# Patient Record
Sex: Female | Born: 1965 | Hispanic: No | Marital: Married
Health system: Southern US, Community
[De-identification: ages and names within clinical notes are randomized; demographics above are authoritative.]

## PROBLEM LIST (undated history)

## (undated) HISTORY — PX: TOTAL THYROIDECTOMY: SHX2547

---

## 2008-09-21 ENCOUNTER — Emergency Department: Payer: Self-pay | Admitting: Emergency Medicine

## 2008-09-28 ENCOUNTER — Ambulatory Visit: Payer: Self-pay | Admitting: Family Medicine

## 2008-10-18 ENCOUNTER — Encounter: Payer: Self-pay | Admitting: Family Medicine

## 2008-10-21 ENCOUNTER — Encounter: Payer: Self-pay | Admitting: Family Medicine

## 2009-08-01 ENCOUNTER — Ambulatory Visit: Payer: Self-pay | Admitting: Family Medicine

## 2009-08-09 ENCOUNTER — Ambulatory Visit: Payer: Self-pay | Admitting: Family Medicine

## 2016-09-18 ENCOUNTER — Other Ambulatory Visit: Payer: Self-pay | Admitting: Internal Medicine

## 2016-09-18 ENCOUNTER — Ambulatory Visit
Admission: RE | Admit: 2016-09-18 | Discharge: 2016-09-18 | Disposition: A | Discharge status: Home / Self Care | Payer: BLUE CROSS/BLUE SHIELD | Source: Ambulatory Visit | Attending: Internal Medicine | Admitting: Internal Medicine

## 2016-09-18 DIAGNOSIS — R1031 Right lower quadrant pain: Secondary | ICD-10-CM | POA: Insufficient documentation

## 2016-09-18 DIAGNOSIS — R1032 Left lower quadrant pain: Secondary | ICD-10-CM

## 2016-09-18 MED ORDER — IOPAMIDOL (ISOVUE-300) INJECTION 61%
100.0000 mL | Freq: Once | INTRAVENOUS | Status: AC | PRN
  Administered 2016-09-18: 100 mL via INTRAVENOUS

## 2017-11-12 ENCOUNTER — Other Ambulatory Visit: Payer: Self-pay | Admitting: Internal Medicine

## 2017-11-12 DIAGNOSIS — Z1231 Encounter for screening mammogram for malignant neoplasm of breast: Secondary | ICD-10-CM

## 2017-11-24 ENCOUNTER — Ambulatory Visit
Admission: RE | Admit: 2017-11-24 | Discharge: 2017-11-24 | Disposition: A | Discharge status: Home / Self Care | Payer: BLUE CROSS/BLUE SHIELD | Source: Ambulatory Visit | Attending: Internal Medicine | Admitting: Internal Medicine

## 2017-11-24 DIAGNOSIS — Z1231 Encounter for screening mammogram for malignant neoplasm of breast: Secondary | ICD-10-CM | POA: Insufficient documentation

## 2017-12-31 ENCOUNTER — Ambulatory Visit
Admission: RE | Admit: 2017-12-31 | Discharge: 2017-12-31 | Disposition: A | Discharge status: Home / Self Care | Payer: BLUE CROSS/BLUE SHIELD | Source: Ambulatory Visit | Attending: Internal Medicine | Admitting: Internal Medicine

## 2017-12-31 ENCOUNTER — Ambulatory Visit: Payer: BLUE CROSS/BLUE SHIELD | Admitting: Certified Registered"

## 2017-12-31 ENCOUNTER — Encounter: Payer: Self-pay | Admitting: *Deleted

## 2017-12-31 ENCOUNTER — Encounter
Admission: RE | Disposition: A | Discharge status: Home / Self Care | Payer: Self-pay | Source: Ambulatory Visit | Attending: Internal Medicine

## 2017-12-31 DIAGNOSIS — Z79899 Other long term (current) drug therapy: Secondary | ICD-10-CM | POA: Insufficient documentation

## 2017-12-31 DIAGNOSIS — Z79890 Hormone replacement therapy (postmenopausal): Secondary | ICD-10-CM | POA: Insufficient documentation

## 2017-12-31 DIAGNOSIS — Z1211 Encounter for screening for malignant neoplasm of colon: Secondary | ICD-10-CM | POA: Diagnosis present

## 2017-12-31 DIAGNOSIS — R1314 Dysphagia, pharyngoesophageal phase: Secondary | ICD-10-CM | POA: Insufficient documentation

## 2017-12-31 DIAGNOSIS — K640 First degree hemorrhoids: Secondary | ICD-10-CM | POA: Insufficient documentation

## 2017-12-31 HISTORY — PX: ESOPHAGOGASTRODUODENOSCOPY: SHX5428

## 2017-12-31 HISTORY — PX: COLONOSCOPY WITH PROPOFOL: SHX5780

## 2017-12-31 SURGERY — EGD (ESOPHAGOGASTRODUODENOSCOPY)
Anesthesia: General

## 2017-12-31 MED ORDER — PROPOFOL 500 MG/50ML IV EMUL
INTRAVENOUS | Status: AC
  Filled 2017-12-31: qty 50

## 2017-12-31 MED ORDER — PROPOFOL 10 MG/ML IV BOLUS
INTRAVENOUS | Status: DC | PRN
  Administered 2017-12-31 (×3): 30 mg via INTRAVENOUS

## 2017-12-31 MED ORDER — SODIUM CHLORIDE 0.9 % IV SOLN
INTRAVENOUS | Status: DC
  Administered 2017-12-31: 1000 mL via INTRAVENOUS

## 2017-12-31 MED ORDER — LIDOCAINE HCL (PF) 2 % IJ SOLN
INTRAMUSCULAR | Status: AC
  Filled 2017-12-31: qty 10

## 2017-12-31 MED ORDER — PHENYLEPHRINE HCL 10 MG/ML IJ SOLN
INTRAMUSCULAR | Status: DC | PRN
  Administered 2017-12-31 (×2): 100 ug via INTRAVENOUS
  Administered 2017-12-31 (×2): 50 ug via INTRAVENOUS

## 2017-12-31 MED ORDER — GLYCOPYRROLATE 0.2 MG/ML IJ SOLN
INTRAMUSCULAR | Status: AC
  Filled 2017-12-31: qty 1

## 2017-12-31 MED ORDER — PHENYLEPHRINE HCL 10 MG/ML IJ SOLN
INTRAMUSCULAR | Status: AC
  Filled 2017-12-31: qty 1

## 2017-12-31 MED ORDER — LIDOCAINE HCL (CARDIAC) PF 100 MG/5ML IV SOSY
PREFILLED_SYRINGE | INTRAVENOUS | Status: DC | PRN
  Administered 2017-12-31: 20 mg via INTRATRACHEAL

## 2017-12-31 MED ORDER — PROPOFOL 500 MG/50ML IV EMUL
INTRAVENOUS | Status: DC | PRN
  Administered 2017-12-31: 80 ug/kg/min via INTRAVENOUS

## 2017-12-31 MED ORDER — GLYCOPYRROLATE 0.2 MG/ML IJ SOLN
INTRAMUSCULAR | Status: DC | PRN
  Administered 2017-12-31: 0.2 mg via INTRAVENOUS

## 2017-12-31 NOTE — Op Note (Signed)
[facility] Gastroenterology Patient Name: Angel Rasmussen Procedure Date: 12/31/2017 10:23 AM MRN: [id number] Account #: [id number] Date of Birth: 06-24-1965 Admit Type: Outpatient Age: 52 Room: [facility] ENDO ROOM 3 Gender: Female Note Status: Finalized Procedure:            Colonoscopy Indications:          Screening for colorectal malignant neoplasm Providers:            Boykin Nearing. Norma Fredrickson MD, MD Referring MD:         Leotis Shames (Referring MD) Medicines:            Propofol per Anesthesia Complications:        No immediate complications. Procedure:            Pre-Anesthesia Assessment:                       - The risks and benefits of the procedure and the                        sedation options and risks were discussed with the                        patient. All questions were answered and informed                        consent was obtained.                       - Patient identification and proposed procedure were                        verified prior to the procedure by the nurse. The                        procedure was verified in the procedure room.                       - ASA Grade Assessment: III - A patient with severe                        systemic disease.                       - After reviewing the risks and benefits, the patient                        was deemed in satisfactory condition to undergo the                        procedure.                       After obtaining informed consent, the colonoscope was                        passed under direct vision. Throughout the procedure,                        the patient's blood pressure, pulse, and oxygen  saturations were monitored continuously. The                        Colonoscope was introduced through the anus and                        advanced to the the cecum, identified by appendiceal                        orifice and ileocecal valve. The colonoscopy was             performed without difficulty. The patient tolerated the                        procedure well. The quality of the bowel preparation                        was good. The ileocecal valve, appendiceal orifice, and                        rectum were photographed. Findings:      The perianal and digital rectal examinations were normal. Pertinent       negatives include normal sphincter tone and no palpable rectal lesions.      The colon (entire examined portion) appeared normal.      Non-bleeding internal hemorrhoids were found during retroflexion. The       hemorrhoids were Grade I (internal hemorrhoids that do not prolapse).      The exam was otherwise without abnormality. Impression:           - The entire examined colon is normal.                       - Non-bleeding internal hemorrhoids.                       - The examination was otherwise normal.                       - No specimens collected. Recommendation:       - Patient has a contact number available for                        emergencies. The signs and symptoms of potential                        delayed complications were discussed with the patient.                        Return to normal activities tomorrow. Written discharge                        instructions were provided to the patient.                       - Monitor results to esophageal dilation                       - Resume previous diet.                       - Continue present medications.                       -  Repeat colonoscopy in 10 years for screening purposes.                       - Return to physician assistant in 3 months.                       - The findings and recommendations were discussed with                        the patient and their family. Procedure Code(s):    --- Professional ---                       W0981, Colorectal cancer screening; colonoscopy on                        individual not meeting criteria for high risk Diagnosis  Code(s):    --- Professional ---                       K64.0, First degree hemorrhoids                       Z12.11, Encounter for screening for malignant neoplasm                        of colon CPT copyright 2018 American Medical Association. All rights reserved. The codes documented in this report are preliminary and upon coder review may  be revised to meet current compliance requirements. Stanton Kidney MD, MD 12/31/2017 10:58:38 AM This report has been signed electronically. Number of Addenda: 0 Note Initiated On: 12/31/2017 10:23 AM Scope Withdrawal Time: 0 hours 6 minutes 34 seconds  Total Procedure Duration: 0 hours 10 minutes 29 seconds       [facility]

## 2017-12-31 NOTE — Transfer of Care (Signed)
Immediate Anesthesia Transfer of Care Note  Patient: Angel Rasmussen  Procedure(s) Performed: ESOPHAGOGASTRODUODENOSCOPY (EGD) (N/A ) COLONOSCOPY WITH PROPOFOL (N/A )  Patient Location: Endoscopy Unit  Anesthesia Type:General  Level of Consciousness: drowsy  Airway & Oxygen Therapy: Patient Spontanous Breathing and Patient connected to nasal cannula oxygen  Post-op Assessment: Report given to RN and Post -op Vital signs reviewed and stable  Post vital signs: stable  Last Vitals:  Vitals Value Taken Time  BP 103/77 12/31/2017 11:05 AM  Temp 36.1 C 12/31/2017 11:00 AM  Pulse 87 12/31/2017 11:05 AM  Resp 16 12/31/2017 11:05 AM  SpO2 100 % 12/31/2017 11:05 AM  Vitals shown include unvalidated device data.  Last Pain:  Vitals:   12/31/17 1100  TempSrc: Tympanic  PainSc:          Complications: No apparent anesthesia complications

## 2017-12-31 NOTE — Op Note (Addendum)
[facility] Gastroenterology Patient Name: Angel Rasmussen Procedure Date: 12/31/2017 10:24 AM MRN: [id number] Account #: [id number] Date of Birth: 03-13-1965 Admit Type: Outpatient Age: 52 Room: [facility] ENDO ROOM 3 Gender: Female Note Status: Finalized Procedure:            Upper GI endoscopy Indications:          Dysphagia Providers:            Boykin Nearing. Norma Fredrickson MD, MD Referring MD:         Leotis Shames (Referring MD) Medicines:            Propofol per Anesthesia Complications:        No immediate complications. Procedure:            Pre-Anesthesia Assessment:                       - The risks and benefits of the procedure and the                        sedation options and risks were discussed with the                        patient. All questions were answered and informed                        consent was obtained.                       - Patient identification and proposed procedure were                        verified prior to the procedure by the nurse. The                        procedure was verified in the procedure room.                       - ASA Grade Assessment: III - A patient with severe                        systemic disease.                       - After reviewing the risks and benefits, the patient                        was deemed in satisfactory condition to undergo the                        procedure.                       After obtaining informed consent, the endoscope was                        passed under direct vision. Throughout the procedure,                        the patient's blood pressure, pulse, and oxygen  saturations were monitored continuously. The Endoscope                        was introduced through the mouth, and advanced to the                        third part of duodenum. The upper GI endoscopy was                        accomplished without difficulty. The patient tolerated    the procedure well. Findings:      No endoscopic abnormality was evident in the esophagus to explain the       patient's complaint of dysphagia. It was decided, however, to proceed       with dilation in the distal esophagus. The scope was withdrawn. Dilation       was performed with a Maloney dilator with no resistance at 54 Fr.      The entire examined stomach was normal.      The examined duodenum was normal.      The exam was otherwise without abnormality. Impression:           - No endoscopic esophageal abnormality to explain                        patient's dysphagia. Esophagus dilated. Dilated.                       - Normal stomach.                       - Normal examined duodenum.                       - The examination was otherwise normal.                       - No specimens collected. Recommendation:       - Monitor results to esophageal dilation                       - Proceed with colonoscopy Procedure Code(s):    --- Professional ---                       [phone], Esophagogastroduodenoscopy, flexible, transoral;                        diagnostic, including collection of specimen(s) by                        brushing or washing, when performed (separate procedure)                       43450, Dilation of esophagus, by unguided sound or                        bougie, single or multiple passes Diagnosis Code(s):    --- Professional ---                       R13.10, Dysphagia, unspecified CPT copyright 2018 American Medical Association. All rights reserved. The codes documented in this report are preliminary and upon coder review  may  be revised to meet current compliance requirements. Stanton Kidneyeodoro K Trusten Hume MD, MD 12/31/2017 10:41:48 AM This report has been signed electronically. Number of Addenda: 0 Note Initiated On: 12/31/2017 10:24 AM      [facility]

## 2017-12-31 NOTE — Interval H&P Note (Signed)
History and Physical Interval Note:  12/31/2017 9:49 AM  Angel ConchHanh Laveda Normanran  has presented today for surgery, with the diagnosis of COLON CANCER SCREENING,DYSPHAGIA  The various methods of treatment have been discussed with the patient and family. After consideration of risks, benefits and other options for treatment, the patient has consented to  Procedure(s): ESOPHAGOGASTRODUODENOSCOPY (EGD) (N/A) COLONOSCOPY WITH PROPOFOL (N/A) as a surgical intervention .  The patient's history has been reviewed, patient examined, no change in status, stable for surgery.  I have reviewed the patient's chart and labs.  Questions were answered to the patient's satisfaction.     Angel Rasmussen, Angel Rasmussen

## 2017-12-31 NOTE — Anesthesia Post-op Follow-up Note (Signed)
Anesthesia QCDR form completed.        

## 2017-12-31 NOTE — H&P (Signed)
Outpatient short stay form Pre-procedure 12/31/2017 9:48 AM Suheyb Raucci K. Norma Fredricksonoledo, M.D.  Primary Physician: Leotis ShamesJasmine Singh, MD  Reason for visit: Esophageal dysphagia, colon cancer screening  History of present illness: 52 year old [place] female presents for 1 year of intermittent solid food dysphagia to the level of the sternal notch without unintentional weight loss, hematemesis.  Patient also presents for colon cancer screening.  Patient denies any GERD symptoms.Patient denies change in bowel habits, rectal bleeding, weight loss or abdominal pain.    No current facility-administered medications for this encounter.   Medications Prior to Admission  Medication Sig Dispense Refill Last Dose  . Acetaminophen (TYLENOL) 325 MG CAPS Take 1,000 mg by mouth daily as needed.     Marland Kitchen. azelastine (ASTELIN) 0.1 % nasal spray Place into both nostrils 2 (two) times daily. Use in each nostril as directed     . conjugated estrogens (PREMARIN) vaginal cream Place 1 Applicatorful vaginally daily.     . fexofenadine (ALLEGRA) 180 MG tablet Take 180 mg by mouth daily.     Marland Kitchen. ibuprofen (ADVIL,MOTRIN) 200 MG tablet Take 200 mg by mouth every 6 (six) hours as needed.        No Known Allergies   History reviewed. No pertinent past medical history.  Review of systems:  Otherwise negative.    Physical Exam  Gen: Alert, oriented. Appears stated age.  HEENT: Steen/AT. PERRLA. Lungs: CTA, no wheezes. CV: RR nl S1, S2. Abd: soft, benign, no masses. BS+ Ext: No edema. Pulses 2+    Planned procedures: Proceed with colonoscopy. The patient understands the nature of the planned procedure, indications, risks, alternatives and potential complications including but not limited to bleeding, infection, perforation, damage to internal organs and possible oversedation/side effects from anesthesia. The patient agrees and gives consent to proceed.  Please refer to procedure notes for findings, recommendations and patient  disposition/instructions.     Blayke Cordrey K. Norma Fredricksonoledo, M.D. Gastroenterology 12/31/2017  9:48 AM

## 2017-12-31 NOTE — Anesthesia Preprocedure Evaluation (Addendum)
Anesthesia Evaluation  Patient identified by MRN, date of birth, ID band Patient awake    Reviewed: Allergy & Precautions, H&P , NPO status , Patient's Chart, lab work & pertinent test results  Airway Mallampati: III  TM Distance: >3 FB     Dental  (+) Missing   Pulmonary neg pulmonary ROS, neg COPD,           Cardiovascular (-) Past MI, (-) Cardiac Stents and (-) CABG negative cardio ROS  (-) dysrhythmias      Neuro/Psych negative neurological ROS  negative psych ROS   GI/Hepatic negative GI ROS, Neg liver ROS,   Endo/Other  negative endocrine ROS  Renal/GU negative Renal ROS  negative genitourinary   Musculoskeletal   Abdominal   Peds  Hematology negative hematology ROS (+)   Anesthesia Other Findings History reviewed. No pertinent past medical history.  Past Surgical History: No date: TOTAL THYROIDECTOMY  BMI    Body Mass Index:  26.37 kg/m      Reproductive/Obstetrics negative OB ROS                            Anesthesia Physical Anesthesia Plan  ASA: I  Anesthesia Plan: General   Post-op Pain Management:    Induction:   PONV Risk Score and Plan: Propofol infusion and TIVA  Airway Management Planned:   Additional Equipment:   Intra-op Plan:   Post-operative Plan:   Informed Consent: I have reviewed the patients History and Physical, chart, labs and discussed the procedure including the risks, benefits and alternatives for the proposed anesthesia with the patient or authorized representative who has indicated his/her understanding and acceptance.   Dental Advisory Given  Plan Discussed with: Anesthesiologist, CRNA and Surgeon  Anesthesia Plan Comments:        Anesthesia Quick Evaluation

## 2018-01-01 ENCOUNTER — Encounter: Payer: Self-pay | Admitting: Internal Medicine

## 2018-01-01 NOTE — Anesthesia Postprocedure Evaluation (Signed)
Anesthesia Post Note  Patient: Lenard SimmerHanh Tippett  Procedure(s) Performed: ESOPHAGOGASTRODUODENOSCOPY (EGD) (N/A ) COLONOSCOPY WITH PROPOFOL (N/A )  Patient location during evaluation: PACU Anesthesia Type: General Level of consciousness: awake and alert Pain management: pain level controlled Vital Signs Assessment: post-procedure vital signs reviewed and stable Respiratory status: spontaneous breathing, nonlabored ventilation, respiratory function stable and patient connected to nasal cannula oxygen Cardiovascular status: blood pressure returned to baseline and stable Postop Assessment: no apparent nausea or vomiting Anesthetic complications: no     Last Vitals:  Vitals:   12/31/17 1120 12/31/17 1130  BP: 118/81 120/79  Pulse: 88 81  Resp: 20 20  Temp:    SpO2: 98% 100%    Last Pain:  Vitals:   12/31/17 1100  TempSrc: Tympanic  PainSc:                  Jovita GammaKathryn L Fitzgerald

## 2019-04-27 IMAGING — MG DIGITAL SCREENING BILATERAL MAMMOGRAM WITH TOMO AND CAD
6 of 12 series · 6 of 36 positions shown · non-contrast
Comparison: None.

CLINICAL DATA: Screening.

EXAM:
DIGITAL SCREENING BILATERAL MAMMOGRAM WITH TOMO AND CAD

[R CC synth-2D]
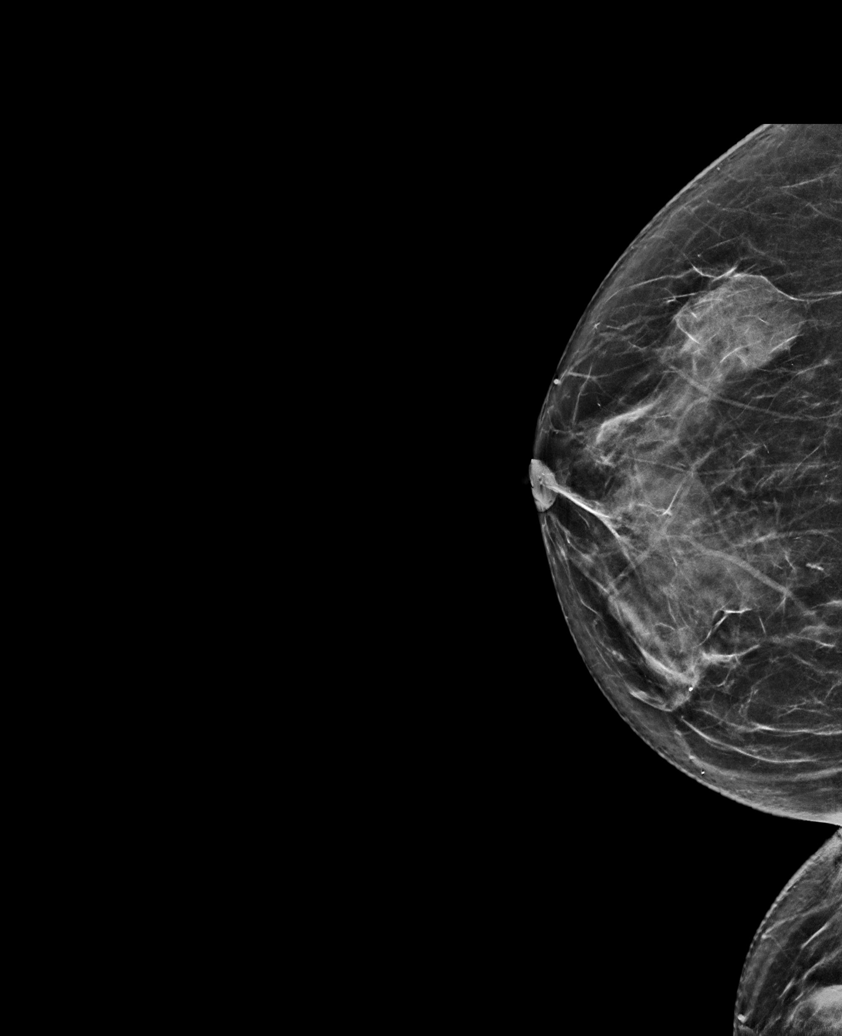

[R MLO synth-2D (1 of 2)]
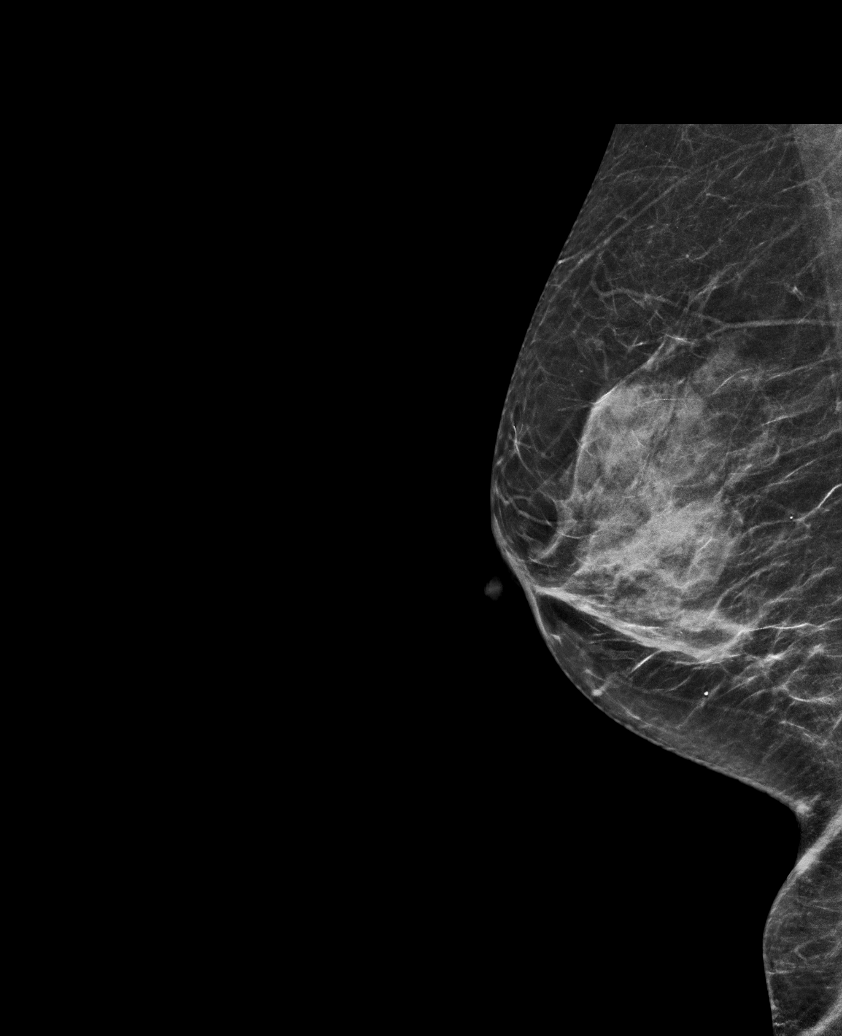

[L CC synth-2D]
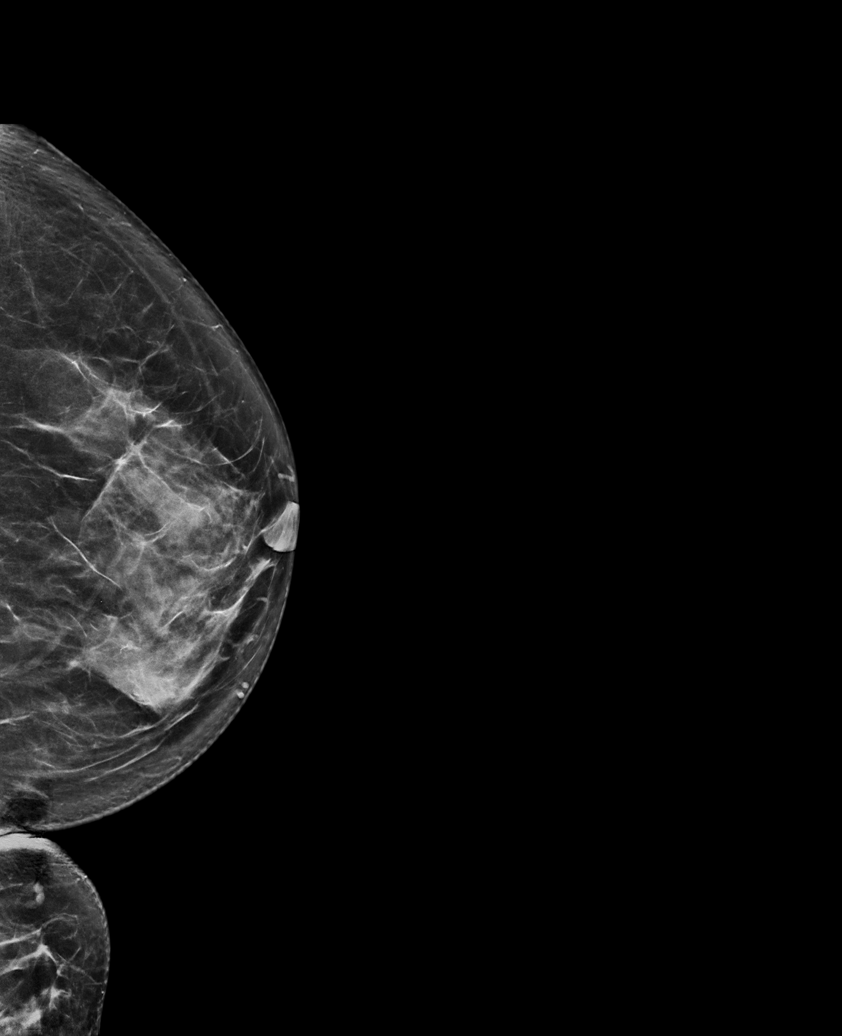

[L MLO synth-2D (1 of 2)]
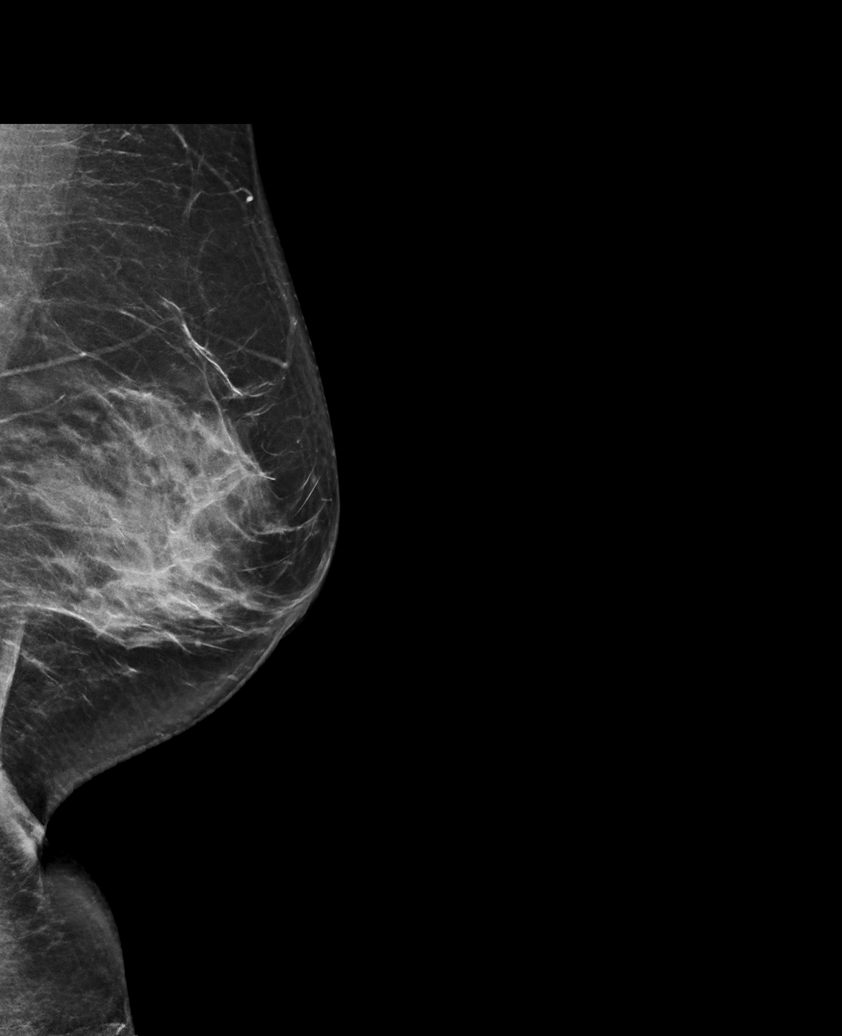

[R MLO synth-2D (2 of 2)]
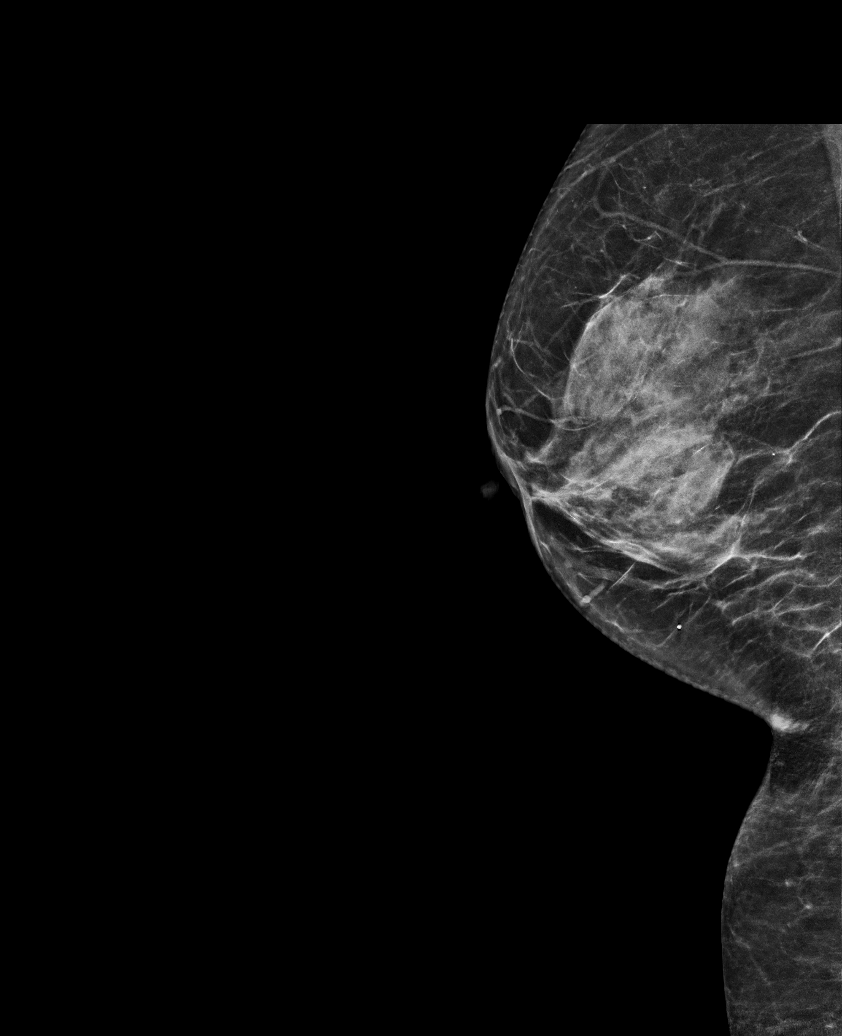

[L MLO synth-2D (2 of 2)]
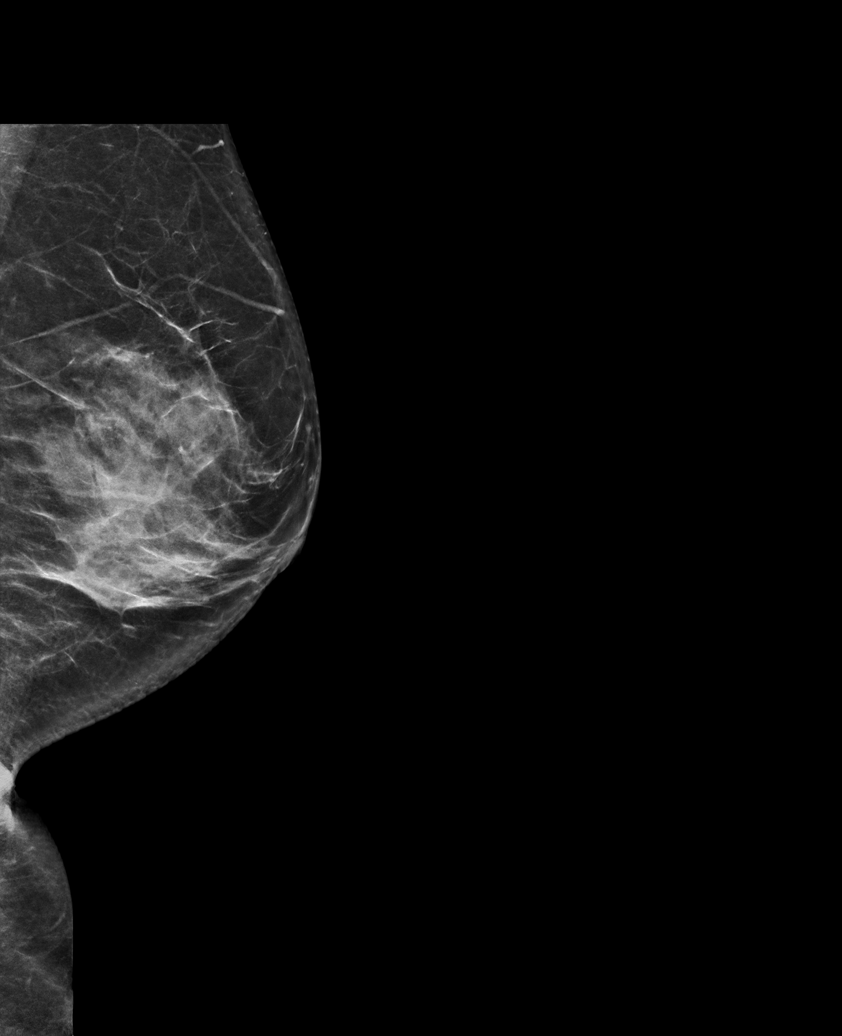

[6 of 36 positions shown; findings below may reference images not displayed]

ACR Breast Density Category c: The breast tissue is heterogeneously
dense, which may obscure small masses
FINDINGS: There are no findings suspicious for malignancy. Images were
processed with CAD.
IMPRESSION: No mammographic evidence of malignancy. A result letter of this
screening mammogram will be mailed directly to the patient.

RECOMMENDATION:
Screening mammogram in one year. (Code:EM-2-IHY)

BI-RADS CATEGORY  1: Negative.

## 2019-06-18 ENCOUNTER — Other Ambulatory Visit: Payer: Self-pay | Admitting: Internal Medicine

## 2019-06-18 DIAGNOSIS — Z1231 Encounter for screening mammogram for malignant neoplasm of breast: Secondary | ICD-10-CM

## 2019-08-25 ENCOUNTER — Ambulatory Visit
Admission: RE | Admit: 2019-08-25 | Discharge: 2019-08-25 | Disposition: A | Discharge status: Home / Self Care | Payer: 59 | Source: Ambulatory Visit | Attending: Internal Medicine | Admitting: Internal Medicine

## 2019-08-25 ENCOUNTER — Other Ambulatory Visit: Payer: Self-pay

## 2019-08-25 DIAGNOSIS — Z1231 Encounter for screening mammogram for malignant neoplasm of breast: Secondary | ICD-10-CM | POA: Insufficient documentation

## 2020-02-16 ENCOUNTER — Inpatient Hospital Stay: Admit: 2020-02-16 | Payer: 59 | Admitting: Orthopedic Surgery

## 2020-02-16 SURGERY — ARTHROPLASTY, KNEE, TOTAL, USING IMAGELESS COMPUTER-ASSISTED NAVIGATION
Anesthesia: Choice | Site: Knee | Laterality: Left

## 2021-04-06 ENCOUNTER — Other Ambulatory Visit: Payer: Self-pay | Admitting: Internal Medicine

## 2021-04-06 DIAGNOSIS — M79604 Pain in right leg: Secondary | ICD-10-CM

## 2021-09-12 ENCOUNTER — Ambulatory Visit
Admission: RE | Admit: 2021-09-12 | Discharge: 2021-09-12 | Disposition: A | Discharge status: Home / Self Care | Payer: 59 | Source: Ambulatory Visit | Attending: Internal Medicine | Admitting: Internal Medicine

## 2021-09-12 DIAGNOSIS — M79604 Pain in right leg: Secondary | ICD-10-CM | POA: Diagnosis not present

## 2021-09-12 DIAGNOSIS — M545 Low back pain, unspecified: Secondary | ICD-10-CM | POA: Diagnosis not present

## 2021-09-12 DIAGNOSIS — L239 Allergic contact dermatitis, unspecified cause: Secondary | ICD-10-CM | POA: Diagnosis not present

## 2021-09-12 DIAGNOSIS — M7989 Other specified soft tissue disorders: Secondary | ICD-10-CM | POA: Diagnosis not present

## 2021-10-15 DIAGNOSIS — K219 Gastro-esophageal reflux disease without esophagitis: Secondary | ICD-10-CM | POA: Diagnosis not present

## 2021-10-15 DIAGNOSIS — M1732 Unilateral post-traumatic osteoarthritis, left knee: Secondary | ICD-10-CM | POA: Diagnosis not present

## 2021-10-25 DIAGNOSIS — M1732 Unilateral post-traumatic osteoarthritis, left knee: Secondary | ICD-10-CM | POA: Diagnosis not present

## 2021-11-28 DIAGNOSIS — M1732 Unilateral post-traumatic osteoarthritis, left knee: Secondary | ICD-10-CM | POA: Diagnosis not present

## 2021-12-10 ENCOUNTER — Other Ambulatory Visit: Payer: Self-pay

## 2021-12-10 ENCOUNTER — Emergency Department
Admission: EM | Admit: 2021-12-10 | Discharge: 2021-12-10 | Disposition: A | Discharge status: Home / Self Care | Payer: 59 | Attending: Emergency Medicine | Admitting: Emergency Medicine

## 2021-12-10 ENCOUNTER — Emergency Department: Payer: 59

## 2021-12-10 ENCOUNTER — Encounter: Payer: Self-pay | Admitting: Emergency Medicine

## 2021-12-10 DIAGNOSIS — Y92481 Parking lot as the place of occurrence of the external cause: Secondary | ICD-10-CM | POA: Diagnosis not present

## 2021-12-10 DIAGNOSIS — S90512A Abrasion, left ankle, initial encounter: Secondary | ICD-10-CM | POA: Diagnosis not present

## 2021-12-10 DIAGNOSIS — W19XXXA Unspecified fall, initial encounter: Secondary | ICD-10-CM

## 2021-12-10 DIAGNOSIS — S8992XA Unspecified injury of left lower leg, initial encounter: Secondary | ICD-10-CM | POA: Diagnosis not present

## 2021-12-10 DIAGNOSIS — S9032XA Contusion of left foot, initial encounter: Secondary | ICD-10-CM | POA: Insufficient documentation

## 2021-12-10 DIAGNOSIS — M79672 Pain in left foot: Secondary | ICD-10-CM | POA: Diagnosis not present

## 2021-12-10 DIAGNOSIS — S299XXA Unspecified injury of thorax, initial encounter: Secondary | ICD-10-CM | POA: Diagnosis not present

## 2021-12-10 DIAGNOSIS — S99922A Unspecified injury of left foot, initial encounter: Secondary | ICD-10-CM | POA: Diagnosis not present

## 2021-12-10 DIAGNOSIS — M79662 Pain in left lower leg: Secondary | ICD-10-CM | POA: Diagnosis not present

## 2021-12-10 DIAGNOSIS — I517 Cardiomegaly: Secondary | ICD-10-CM | POA: Diagnosis not present

## 2021-12-10 MED ORDER — ONDANSETRON 4 MG PO TBDP: 4.0000 mg | ORAL_TABLET | Freq: Three times a day (TID) | ORAL | 0 refills | Status: AC | PRN

## 2021-12-10 MED ORDER — HYDROCODONE-ACETAMINOPHEN 5-325 MG PO TABS: 1.0000 | ORAL_TABLET | Freq: Four times a day (QID) | ORAL | 0 refills | Status: AC | PRN
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
## 2021-12-10 NOTE — Discharge Instructions (Signed)
You can take Norco for pain. Please combine Zofran with Norco in order to avoid nausea. Norco to be constipating so, please start stool softener. Your x-rays are unremarkable.

## 2021-12-21 DIAGNOSIS — M1712 Unilateral primary osteoarthritis, left knee: Secondary | ICD-10-CM | POA: Diagnosis not present

## 2021-12-21 DIAGNOSIS — S81802D Unspecified open wound, left lower leg, subsequent encounter: Secondary | ICD-10-CM | POA: Diagnosis not present

## 2021-12-21 DIAGNOSIS — Z09 Encounter for follow-up examination after completed treatment for conditions other than malignant neoplasm: Secondary | ICD-10-CM | POA: Diagnosis not present

## 2021-12-21 DIAGNOSIS — J029 Acute pharyngitis, unspecified: Secondary | ICD-10-CM | POA: Diagnosis not present

## 2021-12-27 DIAGNOSIS — G8929 Other chronic pain: Secondary | ICD-10-CM | POA: Diagnosis not present

## 2021-12-27 DIAGNOSIS — M25562 Pain in left knee: Secondary | ICD-10-CM | POA: Diagnosis not present

## 2022-01-02 DIAGNOSIS — G8929 Other chronic pain: Secondary | ICD-10-CM | POA: Diagnosis not present

## 2022-01-02 DIAGNOSIS — M25562 Pain in left knee: Secondary | ICD-10-CM | POA: Diagnosis not present

## 2022-01-09 DIAGNOSIS — K219 Gastro-esophageal reflux disease without esophagitis: Secondary | ICD-10-CM | POA: Diagnosis not present

## 2022-01-09 DIAGNOSIS — R350 Frequency of micturition: Secondary | ICD-10-CM | POA: Diagnosis not present

## 2022-01-09 DIAGNOSIS — R739 Hyperglycemia, unspecified: Secondary | ICD-10-CM | POA: Diagnosis not present

## 2022-01-09 DIAGNOSIS — M1712 Unilateral primary osteoarthritis, left knee: Secondary | ICD-10-CM | POA: Diagnosis not present

## 2022-01-09 DIAGNOSIS — L239 Allergic contact dermatitis, unspecified cause: Secondary | ICD-10-CM | POA: Diagnosis not present

## 2022-01-09 DIAGNOSIS — R8290 Unspecified abnormal findings in urine: Secondary | ICD-10-CM | POA: Diagnosis not present

## 2022-01-09 DIAGNOSIS — Z23 Encounter for immunization: Secondary | ICD-10-CM | POA: Diagnosis not present

## 2022-01-10 DIAGNOSIS — R8290 Unspecified abnormal findings in urine: Secondary | ICD-10-CM | POA: Diagnosis not present

## 2022-04-10 DIAGNOSIS — Z780 Asymptomatic menopausal state: Secondary | ICD-10-CM | POA: Diagnosis not present

## 2022-04-11 ENCOUNTER — Other Ambulatory Visit: Payer: Self-pay | Admitting: Internal Medicine

## 2022-04-11 DIAGNOSIS — Z1231 Encounter for screening mammogram for malignant neoplasm of breast: Secondary | ICD-10-CM

## 2022-09-20 DIAGNOSIS — H9201 Otalgia, right ear: Secondary | ICD-10-CM | POA: Diagnosis not present

## 2022-09-20 DIAGNOSIS — R739 Hyperglycemia, unspecified: Secondary | ICD-10-CM | POA: Diagnosis not present

## 2022-09-20 DIAGNOSIS — M25562 Pain in left knee: Secondary | ICD-10-CM | POA: Diagnosis not present

## 2022-10-16 DIAGNOSIS — H663X1 Other chronic suppurative otitis media, right ear: Secondary | ICD-10-CM | POA: Diagnosis not present

## 2022-10-16 DIAGNOSIS — R8290 Unspecified abnormal findings in urine: Secondary | ICD-10-CM | POA: Diagnosis not present

## 2022-10-16 DIAGNOSIS — Z23 Encounter for immunization: Secondary | ICD-10-CM | POA: Diagnosis not present

## 2022-10-16 DIAGNOSIS — K219 Gastro-esophageal reflux disease without esophagitis: Secondary | ICD-10-CM | POA: Diagnosis not present

## 2022-10-16 DIAGNOSIS — M1712 Unilateral primary osteoarthritis, left knee: Secondary | ICD-10-CM | POA: Diagnosis not present

## 2022-10-16 DIAGNOSIS — R1319 Other dysphagia: Secondary | ICD-10-CM | POA: Diagnosis not present

## 2022-10-16 DIAGNOSIS — R7303 Prediabetes: Secondary | ICD-10-CM | POA: Diagnosis not present

## 2022-10-28 ENCOUNTER — Ambulatory Visit
Admission: EM | Admit: 2022-10-28 | Discharge: 2022-10-28 | Disposition: A | Discharge status: Home / Self Care | Payer: 59

## 2022-10-28 DIAGNOSIS — H9201 Otalgia, right ear: Secondary | ICD-10-CM | POA: Diagnosis not present

## 2022-10-28 DIAGNOSIS — H6121 Impacted cerumen, right ear: Secondary | ICD-10-CM | POA: Diagnosis not present

## 2022-10-28 DIAGNOSIS — H6061 Unspecified chronic otitis externa, right ear: Secondary | ICD-10-CM | POA: Diagnosis not present

## 2022-10-28 NOTE — ED Provider Notes (Signed)
Patient presented to urgent care for evaluation of TM perforation.  She actually had an appointment at 2:30 PM at [facility] ENT clinic but due to language barrier, was confused about where exactly to go.  Front desk staff checked patient in, nursing staff triage patient and it was not until I was questioning her about her concerns that we realize she was supposed to be at the ENT clinic.  Contacted ENT clinic who will still see her.  Walked patient directly up to [facility] ENT clinic.   Shirlee Latch, PA-C 10/28/22 1544

## 2022-10-28 NOTE — ED Triage Notes (Addendum)
Patient reports right ear pain x 2-3 months ago, states she was treated and then pain has came back along with drainage 6 days.

## 2022-11-13 DIAGNOSIS — Z01818 Encounter for other preprocedural examination: Secondary | ICD-10-CM | POA: Diagnosis not present

## 2022-11-13 DIAGNOSIS — M1732 Unilateral post-traumatic osteoarthritis, left knee: Secondary | ICD-10-CM | POA: Diagnosis not present

## 2022-11-13 DIAGNOSIS — E559 Vitamin D deficiency, unspecified: Secondary | ICD-10-CM | POA: Diagnosis not present

## 2023-01-29 DIAGNOSIS — R1319 Other dysphagia: Secondary | ICD-10-CM | POA: Diagnosis not present

## 2023-01-29 DIAGNOSIS — M1712 Unilateral primary osteoarthritis, left knee: Secondary | ICD-10-CM | POA: Diagnosis not present

## 2023-01-29 DIAGNOSIS — K219 Gastro-esophageal reflux disease without esophagitis: Secondary | ICD-10-CM | POA: Diagnosis not present

## 2023-02-03 DIAGNOSIS — E559 Vitamin D deficiency, unspecified: Secondary | ICD-10-CM | POA: Diagnosis not present

## 2023-02-03 DIAGNOSIS — M1732 Unilateral post-traumatic osteoarthritis, left knee: Secondary | ICD-10-CM | POA: Diagnosis not present

## 2023-02-03 DIAGNOSIS — B9681 Helicobacter pylori [H. pylori] as the cause of diseases classified elsewhere: Secondary | ICD-10-CM | POA: Diagnosis not present

## 2023-02-03 DIAGNOSIS — R9431 Abnormal electrocardiogram [ECG] [EKG]: Secondary | ICD-10-CM | POA: Diagnosis not present

## 2023-02-03 DIAGNOSIS — Z01818 Encounter for other preprocedural examination: Secondary | ICD-10-CM | POA: Diagnosis not present

## 2023-02-03 DIAGNOSIS — R7303 Prediabetes: Secondary | ICD-10-CM | POA: Diagnosis not present

## 2023-02-03 DIAGNOSIS — K2970 Gastritis, unspecified, without bleeding: Secondary | ICD-10-CM | POA: Diagnosis not present

## 2023-02-06 DIAGNOSIS — M1732 Unilateral post-traumatic osteoarthritis, left knee: Secondary | ICD-10-CM | POA: Diagnosis not present

## 2023-02-11 DIAGNOSIS — Z01810 Encounter for preprocedural cardiovascular examination: Secondary | ICD-10-CM | POA: Diagnosis not present

## 2023-02-11 DIAGNOSIS — R9431 Abnormal electrocardiogram [ECG] [EKG]: Secondary | ICD-10-CM | POA: Diagnosis not present

## 2023-02-18 DIAGNOSIS — M1732 Unilateral post-traumatic osteoarthritis, left knee: Secondary | ICD-10-CM | POA: Diagnosis not present

## 2023-03-21 DIAGNOSIS — M2342 Loose body in knee, left knee: Secondary | ICD-10-CM | POA: Diagnosis not present

## 2023-03-21 DIAGNOSIS — M2419 Other articular cartilage disorders, other specified site: Secondary | ICD-10-CM | POA: Diagnosis not present

## 2023-03-21 DIAGNOSIS — A048 Other specified bacterial intestinal infections: Secondary | ICD-10-CM | POA: Diagnosis not present

## 2023-03-21 DIAGNOSIS — G8918 Other acute postprocedural pain: Secondary | ICD-10-CM | POA: Diagnosis not present

## 2023-03-21 DIAGNOSIS — M2392 Unspecified internal derangement of left knee: Secondary | ICD-10-CM | POA: Diagnosis not present

## 2023-03-21 DIAGNOSIS — M1732 Unilateral post-traumatic osteoarthritis, left knee: Secondary | ICD-10-CM | POA: Diagnosis not present

## 2023-03-21 DIAGNOSIS — F419 Anxiety disorder, unspecified: Secondary | ICD-10-CM | POA: Diagnosis not present

## 2023-03-21 DIAGNOSIS — R7303 Prediabetes: Secondary | ICD-10-CM | POA: Diagnosis not present

## 2023-03-23 DIAGNOSIS — M1732 Unilateral post-traumatic osteoarthritis, left knee: Secondary | ICD-10-CM | POA: Diagnosis not present

## 2023-03-25 DIAGNOSIS — Z7982 Long term (current) use of aspirin: Secondary | ICD-10-CM | POA: Diagnosis not present

## 2023-03-25 DIAGNOSIS — E079 Disorder of thyroid, unspecified: Secondary | ICD-10-CM | POA: Diagnosis not present

## 2023-03-25 DIAGNOSIS — N952 Postmenopausal atrophic vaginitis: Secondary | ICD-10-CM | POA: Diagnosis not present

## 2023-03-25 DIAGNOSIS — Z7901 Long term (current) use of anticoagulants: Secondary | ICD-10-CM | POA: Diagnosis not present

## 2023-03-25 DIAGNOSIS — L309 Dermatitis, unspecified: Secondary | ICD-10-CM | POA: Diagnosis not present

## 2023-03-25 DIAGNOSIS — K219 Gastro-esophageal reflux disease without esophagitis: Secondary | ICD-10-CM | POA: Diagnosis not present

## 2023-03-25 DIAGNOSIS — R7303 Prediabetes: Secondary | ICD-10-CM | POA: Diagnosis not present

## 2023-03-25 DIAGNOSIS — Z471 Aftercare following joint replacement surgery: Secondary | ICD-10-CM | POA: Diagnosis not present

## 2023-03-25 DIAGNOSIS — Z8616 Personal history of COVID-19: Secondary | ICD-10-CM | POA: Diagnosis not present

## 2023-03-25 DIAGNOSIS — J302 Other seasonal allergic rhinitis: Secondary | ICD-10-CM | POA: Diagnosis not present

## 2023-03-25 DIAGNOSIS — Z96652 Presence of left artificial knee joint: Secondary | ICD-10-CM | POA: Diagnosis not present

## 2023-03-25 DIAGNOSIS — J329 Chronic sinusitis, unspecified: Secondary | ICD-10-CM | POA: Diagnosis not present

## 2023-03-26 DIAGNOSIS — N952 Postmenopausal atrophic vaginitis: Secondary | ICD-10-CM | POA: Diagnosis not present

## 2023-03-26 DIAGNOSIS — Z8616 Personal history of COVID-19: Secondary | ICD-10-CM | POA: Diagnosis not present

## 2023-03-26 DIAGNOSIS — Z7901 Long term (current) use of anticoagulants: Secondary | ICD-10-CM | POA: Diagnosis not present

## 2023-03-26 DIAGNOSIS — R7303 Prediabetes: Secondary | ICD-10-CM | POA: Diagnosis not present

## 2023-03-26 DIAGNOSIS — L309 Dermatitis, unspecified: Secondary | ICD-10-CM | POA: Diagnosis not present

## 2023-03-26 DIAGNOSIS — Z7982 Long term (current) use of aspirin: Secondary | ICD-10-CM | POA: Diagnosis not present

## 2023-03-26 DIAGNOSIS — Z471 Aftercare following joint replacement surgery: Secondary | ICD-10-CM | POA: Diagnosis not present

## 2023-03-26 DIAGNOSIS — J329 Chronic sinusitis, unspecified: Secondary | ICD-10-CM | POA: Diagnosis not present

## 2023-03-26 DIAGNOSIS — K219 Gastro-esophageal reflux disease without esophagitis: Secondary | ICD-10-CM | POA: Diagnosis not present

## 2023-03-26 DIAGNOSIS — J302 Other seasonal allergic rhinitis: Secondary | ICD-10-CM | POA: Diagnosis not present

## 2023-03-26 DIAGNOSIS — E079 Disorder of thyroid, unspecified: Secondary | ICD-10-CM | POA: Diagnosis not present

## 2023-03-26 DIAGNOSIS — Z96652 Presence of left artificial knee joint: Secondary | ICD-10-CM | POA: Diagnosis not present

## 2023-03-28 DIAGNOSIS — Z8616 Personal history of COVID-19: Secondary | ICD-10-CM | POA: Diagnosis not present

## 2023-03-28 DIAGNOSIS — J302 Other seasonal allergic rhinitis: Secondary | ICD-10-CM | POA: Diagnosis not present

## 2023-03-28 DIAGNOSIS — Z7982 Long term (current) use of aspirin: Secondary | ICD-10-CM | POA: Diagnosis not present

## 2023-03-28 DIAGNOSIS — K219 Gastro-esophageal reflux disease without esophagitis: Secondary | ICD-10-CM | POA: Diagnosis not present

## 2023-03-28 DIAGNOSIS — R7303 Prediabetes: Secondary | ICD-10-CM | POA: Diagnosis not present

## 2023-03-28 DIAGNOSIS — Z96652 Presence of left artificial knee joint: Secondary | ICD-10-CM | POA: Diagnosis not present

## 2023-03-28 DIAGNOSIS — J329 Chronic sinusitis, unspecified: Secondary | ICD-10-CM | POA: Diagnosis not present

## 2023-03-28 DIAGNOSIS — E079 Disorder of thyroid, unspecified: Secondary | ICD-10-CM | POA: Diagnosis not present

## 2023-03-28 DIAGNOSIS — L309 Dermatitis, unspecified: Secondary | ICD-10-CM | POA: Diagnosis not present

## 2023-03-28 DIAGNOSIS — Z7901 Long term (current) use of anticoagulants: Secondary | ICD-10-CM | POA: Diagnosis not present

## 2023-03-28 DIAGNOSIS — N952 Postmenopausal atrophic vaginitis: Secondary | ICD-10-CM | POA: Diagnosis not present

## 2023-03-28 DIAGNOSIS — Z471 Aftercare following joint replacement surgery: Secondary | ICD-10-CM | POA: Diagnosis not present

## 2023-03-31 DIAGNOSIS — Z96652 Presence of left artificial knee joint: Secondary | ICD-10-CM | POA: Diagnosis not present

## 2023-03-31 DIAGNOSIS — Z8616 Personal history of COVID-19: Secondary | ICD-10-CM | POA: Diagnosis not present

## 2023-03-31 DIAGNOSIS — Z7982 Long term (current) use of aspirin: Secondary | ICD-10-CM | POA: Diagnosis not present

## 2023-03-31 DIAGNOSIS — E079 Disorder of thyroid, unspecified: Secondary | ICD-10-CM | POA: Diagnosis not present

## 2023-03-31 DIAGNOSIS — J329 Chronic sinusitis, unspecified: Secondary | ICD-10-CM | POA: Diagnosis not present

## 2023-03-31 DIAGNOSIS — L309 Dermatitis, unspecified: Secondary | ICD-10-CM | POA: Diagnosis not present

## 2023-03-31 DIAGNOSIS — K219 Gastro-esophageal reflux disease without esophagitis: Secondary | ICD-10-CM | POA: Diagnosis not present

## 2023-03-31 DIAGNOSIS — Z471 Aftercare following joint replacement surgery: Secondary | ICD-10-CM | POA: Diagnosis not present

## 2023-03-31 DIAGNOSIS — N952 Postmenopausal atrophic vaginitis: Secondary | ICD-10-CM | POA: Diagnosis not present

## 2023-03-31 DIAGNOSIS — R7303 Prediabetes: Secondary | ICD-10-CM | POA: Diagnosis not present

## 2023-03-31 DIAGNOSIS — J302 Other seasonal allergic rhinitis: Secondary | ICD-10-CM | POA: Diagnosis not present

## 2023-03-31 DIAGNOSIS — Z7901 Long term (current) use of anticoagulants: Secondary | ICD-10-CM | POA: Diagnosis not present

## 2023-04-02 DIAGNOSIS — R7303 Prediabetes: Secondary | ICD-10-CM | POA: Diagnosis not present

## 2023-04-02 DIAGNOSIS — Z8616 Personal history of COVID-19: Secondary | ICD-10-CM | POA: Diagnosis not present

## 2023-04-02 DIAGNOSIS — Z7982 Long term (current) use of aspirin: Secondary | ICD-10-CM | POA: Diagnosis not present

## 2023-04-02 DIAGNOSIS — K219 Gastro-esophageal reflux disease without esophagitis: Secondary | ICD-10-CM | POA: Diagnosis not present

## 2023-04-02 DIAGNOSIS — J329 Chronic sinusitis, unspecified: Secondary | ICD-10-CM | POA: Diagnosis not present

## 2023-04-02 DIAGNOSIS — Z471 Aftercare following joint replacement surgery: Secondary | ICD-10-CM | POA: Diagnosis not present

## 2023-04-02 DIAGNOSIS — L309 Dermatitis, unspecified: Secondary | ICD-10-CM | POA: Diagnosis not present

## 2023-04-02 DIAGNOSIS — J302 Other seasonal allergic rhinitis: Secondary | ICD-10-CM | POA: Diagnosis not present

## 2023-04-02 DIAGNOSIS — E079 Disorder of thyroid, unspecified: Secondary | ICD-10-CM | POA: Diagnosis not present

## 2023-04-02 DIAGNOSIS — Z7901 Long term (current) use of anticoagulants: Secondary | ICD-10-CM | POA: Diagnosis not present

## 2023-04-02 DIAGNOSIS — N952 Postmenopausal atrophic vaginitis: Secondary | ICD-10-CM | POA: Diagnosis not present

## 2023-04-02 DIAGNOSIS — Z96652 Presence of left artificial knee joint: Secondary | ICD-10-CM | POA: Diagnosis not present

## 2023-04-04 DIAGNOSIS — E079 Disorder of thyroid, unspecified: Secondary | ICD-10-CM | POA: Diagnosis not present

## 2023-04-04 DIAGNOSIS — Z8616 Personal history of COVID-19: Secondary | ICD-10-CM | POA: Diagnosis not present

## 2023-04-04 DIAGNOSIS — Z7982 Long term (current) use of aspirin: Secondary | ICD-10-CM | POA: Diagnosis not present

## 2023-04-04 DIAGNOSIS — J329 Chronic sinusitis, unspecified: Secondary | ICD-10-CM | POA: Diagnosis not present

## 2023-04-04 DIAGNOSIS — Z96652 Presence of left artificial knee joint: Secondary | ICD-10-CM | POA: Diagnosis not present

## 2023-04-04 DIAGNOSIS — J302 Other seasonal allergic rhinitis: Secondary | ICD-10-CM | POA: Diagnosis not present

## 2023-04-04 DIAGNOSIS — Z7901 Long term (current) use of anticoagulants: Secondary | ICD-10-CM | POA: Diagnosis not present

## 2023-04-04 DIAGNOSIS — L309 Dermatitis, unspecified: Secondary | ICD-10-CM | POA: Diagnosis not present

## 2023-04-04 DIAGNOSIS — N952 Postmenopausal atrophic vaginitis: Secondary | ICD-10-CM | POA: Diagnosis not present

## 2023-04-04 DIAGNOSIS — R7303 Prediabetes: Secondary | ICD-10-CM | POA: Diagnosis not present

## 2023-04-04 DIAGNOSIS — K219 Gastro-esophageal reflux disease without esophagitis: Secondary | ICD-10-CM | POA: Diagnosis not present

## 2023-04-04 DIAGNOSIS — Z471 Aftercare following joint replacement surgery: Secondary | ICD-10-CM | POA: Diagnosis not present

## 2023-04-08 DIAGNOSIS — Z471 Aftercare following joint replacement surgery: Secondary | ICD-10-CM | POA: Diagnosis not present

## 2023-04-08 DIAGNOSIS — Z7901 Long term (current) use of anticoagulants: Secondary | ICD-10-CM | POA: Diagnosis not present

## 2023-04-08 DIAGNOSIS — L309 Dermatitis, unspecified: Secondary | ICD-10-CM | POA: Diagnosis not present

## 2023-04-08 DIAGNOSIS — K219 Gastro-esophageal reflux disease without esophagitis: Secondary | ICD-10-CM | POA: Diagnosis not present

## 2023-04-08 DIAGNOSIS — Z7982 Long term (current) use of aspirin: Secondary | ICD-10-CM | POA: Diagnosis not present

## 2023-04-08 DIAGNOSIS — Z96652 Presence of left artificial knee joint: Secondary | ICD-10-CM | POA: Diagnosis not present

## 2023-04-08 DIAGNOSIS — E079 Disorder of thyroid, unspecified: Secondary | ICD-10-CM | POA: Diagnosis not present

## 2023-04-08 DIAGNOSIS — Z8616 Personal history of COVID-19: Secondary | ICD-10-CM | POA: Diagnosis not present

## 2023-04-08 DIAGNOSIS — J302 Other seasonal allergic rhinitis: Secondary | ICD-10-CM | POA: Diagnosis not present

## 2023-04-08 DIAGNOSIS — J329 Chronic sinusitis, unspecified: Secondary | ICD-10-CM | POA: Diagnosis not present

## 2023-04-08 DIAGNOSIS — N952 Postmenopausal atrophic vaginitis: Secondary | ICD-10-CM | POA: Diagnosis not present

## 2023-04-08 DIAGNOSIS — R7303 Prediabetes: Secondary | ICD-10-CM | POA: Diagnosis not present

## 2023-04-11 DIAGNOSIS — Z471 Aftercare following joint replacement surgery: Secondary | ICD-10-CM | POA: Diagnosis not present

## 2023-04-11 DIAGNOSIS — J329 Chronic sinusitis, unspecified: Secondary | ICD-10-CM | POA: Diagnosis not present

## 2023-04-11 DIAGNOSIS — J302 Other seasonal allergic rhinitis: Secondary | ICD-10-CM | POA: Diagnosis not present

## 2023-04-11 DIAGNOSIS — L309 Dermatitis, unspecified: Secondary | ICD-10-CM | POA: Diagnosis not present

## 2023-04-11 DIAGNOSIS — E079 Disorder of thyroid, unspecified: Secondary | ICD-10-CM | POA: Diagnosis not present

## 2023-04-11 DIAGNOSIS — Z7901 Long term (current) use of anticoagulants: Secondary | ICD-10-CM | POA: Diagnosis not present

## 2023-04-11 DIAGNOSIS — R7303 Prediabetes: Secondary | ICD-10-CM | POA: Diagnosis not present

## 2023-04-11 DIAGNOSIS — N952 Postmenopausal atrophic vaginitis: Secondary | ICD-10-CM | POA: Diagnosis not present

## 2023-04-11 DIAGNOSIS — Z7982 Long term (current) use of aspirin: Secondary | ICD-10-CM | POA: Diagnosis not present

## 2023-04-11 DIAGNOSIS — Z96652 Presence of left artificial knee joint: Secondary | ICD-10-CM | POA: Diagnosis not present

## 2023-04-11 DIAGNOSIS — K219 Gastro-esophageal reflux disease without esophagitis: Secondary | ICD-10-CM | POA: Diagnosis not present

## 2023-04-11 DIAGNOSIS — Z8616 Personal history of COVID-19: Secondary | ICD-10-CM | POA: Diagnosis not present

## 2023-04-14 DIAGNOSIS — Z96652 Presence of left artificial knee joint: Secondary | ICD-10-CM | POA: Diagnosis not present

## 2023-04-14 DIAGNOSIS — J302 Other seasonal allergic rhinitis: Secondary | ICD-10-CM | POA: Diagnosis not present

## 2023-04-14 DIAGNOSIS — Z471 Aftercare following joint replacement surgery: Secondary | ICD-10-CM | POA: Diagnosis not present

## 2023-04-14 DIAGNOSIS — J329 Chronic sinusitis, unspecified: Secondary | ICD-10-CM | POA: Diagnosis not present

## 2023-04-14 DIAGNOSIS — L309 Dermatitis, unspecified: Secondary | ICD-10-CM | POA: Diagnosis not present

## 2023-04-14 DIAGNOSIS — R7303 Prediabetes: Secondary | ICD-10-CM | POA: Diagnosis not present

## 2023-04-14 DIAGNOSIS — Z7982 Long term (current) use of aspirin: Secondary | ICD-10-CM | POA: Diagnosis not present

## 2023-04-14 DIAGNOSIS — N952 Postmenopausal atrophic vaginitis: Secondary | ICD-10-CM | POA: Diagnosis not present

## 2023-04-14 DIAGNOSIS — K219 Gastro-esophageal reflux disease without esophagitis: Secondary | ICD-10-CM | POA: Diagnosis not present

## 2023-04-14 DIAGNOSIS — Z7901 Long term (current) use of anticoagulants: Secondary | ICD-10-CM | POA: Diagnosis not present

## 2023-04-14 DIAGNOSIS — Z8616 Personal history of COVID-19: Secondary | ICD-10-CM | POA: Diagnosis not present

## 2023-04-14 DIAGNOSIS — E079 Disorder of thyroid, unspecified: Secondary | ICD-10-CM | POA: Diagnosis not present

## 2023-04-16 DIAGNOSIS — R7303 Prediabetes: Secondary | ICD-10-CM | POA: Diagnosis not present

## 2023-04-16 DIAGNOSIS — Z8616 Personal history of COVID-19: Secondary | ICD-10-CM | POA: Diagnosis not present

## 2023-04-16 DIAGNOSIS — L309 Dermatitis, unspecified: Secondary | ICD-10-CM | POA: Diagnosis not present

## 2023-04-16 DIAGNOSIS — K219 Gastro-esophageal reflux disease without esophagitis: Secondary | ICD-10-CM | POA: Diagnosis not present

## 2023-04-16 DIAGNOSIS — Z96652 Presence of left artificial knee joint: Secondary | ICD-10-CM | POA: Diagnosis not present

## 2023-04-16 DIAGNOSIS — Z7901 Long term (current) use of anticoagulants: Secondary | ICD-10-CM | POA: Diagnosis not present

## 2023-04-16 DIAGNOSIS — Z7982 Long term (current) use of aspirin: Secondary | ICD-10-CM | POA: Diagnosis not present

## 2023-04-16 DIAGNOSIS — Z471 Aftercare following joint replacement surgery: Secondary | ICD-10-CM | POA: Diagnosis not present

## 2023-04-16 DIAGNOSIS — N952 Postmenopausal atrophic vaginitis: Secondary | ICD-10-CM | POA: Diagnosis not present

## 2023-04-16 DIAGNOSIS — E079 Disorder of thyroid, unspecified: Secondary | ICD-10-CM | POA: Diagnosis not present

## 2023-04-16 DIAGNOSIS — J302 Other seasonal allergic rhinitis: Secondary | ICD-10-CM | POA: Diagnosis not present

## 2023-04-16 DIAGNOSIS — J329 Chronic sinusitis, unspecified: Secondary | ICD-10-CM | POA: Diagnosis not present

## 2023-05-19 DIAGNOSIS — Z96652 Presence of left artificial knee joint: Secondary | ICD-10-CM | POA: Diagnosis not present

## 2023-05-21 DIAGNOSIS — M25662 Stiffness of left knee, not elsewhere classified: Secondary | ICD-10-CM | POA: Diagnosis not present

## 2023-05-21 DIAGNOSIS — Z96652 Presence of left artificial knee joint: Secondary | ICD-10-CM | POA: Diagnosis not present

## 2023-05-27 DIAGNOSIS — Z471 Aftercare following joint replacement surgery: Secondary | ICD-10-CM | POA: Diagnosis not present

## 2023-05-27 DIAGNOSIS — M25662 Stiffness of left knee, not elsewhere classified: Secondary | ICD-10-CM | POA: Diagnosis not present

## 2023-05-27 DIAGNOSIS — Z96652 Presence of left artificial knee joint: Secondary | ICD-10-CM | POA: Diagnosis not present

## 2023-05-29 DIAGNOSIS — Z96652 Presence of left artificial knee joint: Secondary | ICD-10-CM | POA: Diagnosis not present

## 2023-05-29 DIAGNOSIS — M25662 Stiffness of left knee, not elsewhere classified: Secondary | ICD-10-CM | POA: Diagnosis not present

## 2023-06-03 DIAGNOSIS — M25662 Stiffness of left knee, not elsewhere classified: Secondary | ICD-10-CM | POA: Diagnosis not present

## 2023-06-03 DIAGNOSIS — Z96652 Presence of left artificial knee joint: Secondary | ICD-10-CM | POA: Diagnosis not present

## 2023-06-05 DIAGNOSIS — Z96652 Presence of left artificial knee joint: Secondary | ICD-10-CM | POA: Diagnosis not present

## 2023-06-05 DIAGNOSIS — M25662 Stiffness of left knee, not elsewhere classified: Secondary | ICD-10-CM | POA: Diagnosis not present

## 2023-06-17 DIAGNOSIS — M25662 Stiffness of left knee, not elsewhere classified: Secondary | ICD-10-CM | POA: Diagnosis not present

## 2023-06-17 DIAGNOSIS — Z96652 Presence of left artificial knee joint: Secondary | ICD-10-CM | POA: Diagnosis not present

## 2023-06-25 DIAGNOSIS — Z96652 Presence of left artificial knee joint: Secondary | ICD-10-CM | POA: Diagnosis not present

## 2023-07-02 DIAGNOSIS — M25662 Stiffness of left knee, not elsewhere classified: Secondary | ICD-10-CM | POA: Diagnosis not present

## 2023-07-02 DIAGNOSIS — Z96652 Presence of left artificial knee joint: Secondary | ICD-10-CM | POA: Diagnosis not present

## 2023-07-09 DIAGNOSIS — Z96652 Presence of left artificial knee joint: Secondary | ICD-10-CM | POA: Diagnosis not present

## 2023-07-15 ENCOUNTER — Other Ambulatory Visit: Payer: Self-pay | Admitting: Internal Medicine

## 2023-07-15 DIAGNOSIS — E559 Vitamin D deficiency, unspecified: Secondary | ICD-10-CM | POA: Diagnosis not present

## 2023-07-15 DIAGNOSIS — Z1331 Encounter for screening for depression: Secondary | ICD-10-CM | POA: Diagnosis not present

## 2023-07-15 DIAGNOSIS — Z1231 Encounter for screening mammogram for malignant neoplasm of breast: Secondary | ICD-10-CM

## 2023-07-15 DIAGNOSIS — R1319 Other dysphagia: Secondary | ICD-10-CM | POA: Diagnosis not present

## 2023-07-15 DIAGNOSIS — K219 Gastro-esophageal reflux disease without esophagitis: Secondary | ICD-10-CM | POA: Diagnosis not present

## 2023-07-15 DIAGNOSIS — R7303 Prediabetes: Secondary | ICD-10-CM | POA: Diagnosis not present

## 2023-07-15 DIAGNOSIS — Z124 Encounter for screening for malignant neoplasm of cervix: Secondary | ICD-10-CM | POA: Diagnosis not present

## 2023-07-15 DIAGNOSIS — Z0000 Encounter for general adult medical examination without abnormal findings: Secondary | ICD-10-CM | POA: Diagnosis not present

## 2023-07-15 DIAGNOSIS — E538 Deficiency of other specified B group vitamins: Secondary | ICD-10-CM | POA: Diagnosis not present

## 2023-08-12 DIAGNOSIS — D649 Anemia, unspecified: Secondary | ICD-10-CM | POA: Diagnosis not present
